# Patient Record
Sex: Female | Born: 2000 | Race: Black or African American | Hispanic: No | Marital: Single | Smoking: Never smoker
Health system: Southern US, Community
[De-identification: ages and names within clinical notes are randomized; demographics above are authoritative.]

## PROBLEM LIST (undated history)

## (undated) DIAGNOSIS — D649 Anemia, unspecified: Secondary | ICD-10-CM

## (undated) HISTORY — PX: NO PAST SURGERIES: SHX2092

## (undated) HISTORY — DX: Anemia, unspecified: D64.9

---
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
## 2021-03-02 ENCOUNTER — Ambulatory Visit: Payer: Self-pay | Admitting: Nurse Practitioner

## 2021-03-12 ENCOUNTER — Ambulatory Visit (INDEPENDENT_AMBULATORY_CARE_PROVIDER_SITE_OTHER): Payer: 59 | Admitting: Nurse Practitioner

## 2021-03-12 ENCOUNTER — Other Ambulatory Visit: Payer: Self-pay

## 2021-03-12 ENCOUNTER — Encounter: Payer: Self-pay | Admitting: Nurse Practitioner

## 2021-03-12 VITALS — BP 113/75 | HR 85 | Temp 98.8°F | Ht 65.5 in | Wt 96.0 lb

## 2021-03-12 DIAGNOSIS — Z0001 Encounter for general adult medical examination with abnormal findings: Secondary | ICD-10-CM

## 2021-03-12 DIAGNOSIS — R636 Underweight: Secondary | ICD-10-CM | POA: Diagnosis not present

## 2021-03-12 DIAGNOSIS — Z139 Encounter for screening, unspecified: Secondary | ICD-10-CM | POA: Diagnosis not present

## 2021-03-12 DIAGNOSIS — Z7689 Persons encountering health services in other specified circumstances: Secondary | ICD-10-CM

## 2021-03-12 NOTE — Assessment & Plan Note (Addendum)
Wt Readings from Last 3 Encounters:  03/12/21 96 lb (43.5 kg) (2 %, Z= -2.13)*  05/09/20 100 lb 8 oz (45.6 kg) (6 %, Z= -1.59)*  04/30/17 97 lb 9.6 oz (44.3 kg) (16 %, Z= -1.00)*   * Growth percentiles are based on CDC (Girls, 2-20 Years) data.   BMI Readings from Last 3 Encounters:  03/12/21 15.73 kg/m (<1 %, Z= -3.11)*  05/09/20 16.72 kg/m (1 %, Z= -2.17)*  04/30/17 16.24 kg/m (5 %, Z= -1.66)*   * Growth percentiles are based on CDC (Girls, 2-20 Years) data.   -she states that she eats a lot but is unable to gain weight -will check TSH -she is going to go to [place] and [place] to Consulting civil engineer

## 2021-03-12 NOTE — Progress Notes (Signed)
Established Patient Office Visit  Subjective:  Patient ID: Jennifer Frey, female    DOB: 2001-03-18  Age: 20 y.o. MRN: [id number]  CC:  Chief Complaint  Patient presents with   New Patient (Initial Visit)    Here to establish care. Needs CPE for school, leaving Friday.    HPI Jennifer Frey presents for new patient visit/physical exam. Transferring care from [facility] Last physical was 04/2020 Last labs was drawn in Nov 2021.  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   Father lives in [place].   Social Determinants of Health   Financial Resource Strain: Not on file  Food Insecurity: Not on file  Transportation Needs: Not on file  Physical Activity: Not on file  Stress: Not on file  Social Connections: Not on file  Intimate Partner Violence: Not on file    Outpatient Medications Prior to Visit  Medication Sig Dispense Refill   ALAYCEN 1/35 tablet Take 1 tablet by mouth daily.     tretinoin (RETIN-A) 0.05 % cream Apply 1 application topically at bedtime. (Patient not taking: Reported on 03/12/2021)     No facility-administered  medications prior to visit.    No Known Allergies  ROS Review of Systems  Constitutional: Negative.   HENT: Negative.    Eyes: Negative.   Respiratory: Negative.    Cardiovascular: Negative.   Gastrointestinal: Negative.   Endocrine: Negative.   Genitourinary: Negative.   Musculoskeletal: Negative.   Skin: Negative.   Allergic/Immunologic: Negative.   Neurological: Negative.   Hematological: Negative.   Psychiatric/Behavioral: Negative.       Objective:    Physical Exam Constitutional:      Appearance: Normal appearance.     Comments: underweight  HENT:     Head: Normocephalic and atraumatic.     Right Ear: Tympanic membrane, ear canal and external ear normal.     Left Ear: Tympanic membrane, ear canal and external ear normal.     Nose: Nose normal.     Mouth/Throat:     Mouth: Mucous membranes are moist.     Pharynx: Oropharynx is clear.  Eyes:     Extraocular Movements: Extraocular movements intact.     Conjunctiva/sclera: Conjunctivae normal.     Pupils: Pupils are equal, round, and reactive to light.  Cardiovascular:     Rate and Rhythm: Normal rate and regular rhythm.     Pulses: Normal pulses.  Heart sounds: Normal heart sounds.  Pulmonary:     Effort: Pulmonary effort is normal.     Breath sounds: Normal breath sounds.  Abdominal:     General: Abdomen is flat. Bowel sounds are normal.     Palpations: Abdomen is soft.  Musculoskeletal:        General: Normal range of motion.     Cervical back: Normal range of motion and neck supple.  Skin:    General: Skin is warm and dry.     Capillary Refill: Capillary refill takes less than 2 seconds.  Neurological:     General: No focal deficit present.     Mental Status: She is alert and oriented to person, place, and time.     Cranial Nerves: No cranial nerve deficit.     Sensory: No sensory deficit.     Motor: No weakness.     Coordination: Coordination normal.     Gait: Gait normal.  Psychiatric:         Mood and Affect: Mood normal.        Behavior: Behavior normal.        Thought Content: Thought content normal.        Judgment: Judgment normal.    BP 113/75 (BP Location: Left Arm, Patient Position: Sitting, Cuff Size: Normal)   Pulse 85   Temp 98.8 F (37.1 C) (Oral)   Ht 5' 5.5" (1.664 m)   Wt 96 lb (43.5 kg)   LMP 02/25/2021 (Approximate)   SpO2 98%   BMI 15.73 kg/m  Wt Readings from Last 3 Encounters:  03/12/21 96 lb (43.5 kg) (2 %, Z= -2.13)*  05/09/20 100 lb 8 oz (45.6 kg) (6 %, Z= -1.59)*  04/30/17 97 lb 9.6 oz (44.3 kg) (16 %, Z= -1.00)*   * Growth percentiles are based on CDC (Girls, 2-20 Years) data.     Health Maintenance Due  Topic Date Due   HIV Screening  Never done   Hepatitis C Screening  Never done   INFLUENZA VACCINE  02/19/2021    There are no preventive care reminders to display for this patient.  Lab Results  Component Value Date   TSH 0.94 05/22/2020   Lab Results  Component Value Date   WBC 5.7 05/22/2020   HGB 10.4 (L) 05/22/2020   HCT 33.6 (L) 05/22/2020   MCV 85.3 05/22/2020   PLT 268 05/22/2020   Lab Results  Component Value Date   NA 138 05/22/2020   K 4.1 05/22/2020   CO2 24 05/22/2020   GLUCOSE 94 05/22/2020   BUN 9 05/22/2020   CREATININE 0.51 05/22/2020   BILITOT 0.2 05/22/2020   AST 14 05/22/2020   ALT 9 05/22/2020   PROT 7.7 05/22/2020   CALCIUM 10.0 05/22/2020   Lab Results  Component Value Date   CHOL 169 05/22/2020   Lab Results  Component Value Date   HDL 84 05/22/2020   Lab Results  Component Value Date   LDLCALC 72 05/22/2020   Lab Results  Component Value Date   TRIG 46 05/22/2020   Lab Results  Component Value Date   CHOLHDL 2.0 05/22/2020   Lab Results  Component Value Date   HGBA1C 5.6 05/22/2020      Assessment & Plan:   Problem List Items Addressed This Visit       Other   Encounter to establish care - Primary    -records available in Epic      Relevant Orders  Hepatitis  C Antibody   HIV antibody (with reflex)   CBC with Differential/Platelet   CMP14+EGFR   Lipid Panel With LDL/HDL Ratio   TSH   Underweight    Wt Readings from Last 3 Encounters:  03/12/21 96 lb (43.5 kg) (2 %, Z= -2.13)*  05/09/20 100 lb 8 oz (45.6 kg) (6 %, Z= -1.59)*  04/30/17 97 lb 9.6 oz (44.3 kg) (16 %, Z= -1.00)*   * Growth percentiles are based on CDC (Girls, 2-20 Years) data.   BMI Readings from Last 3 Encounters:  03/12/21 15.73 kg/m (<1 %, Z= -3.11)*  05/09/20 16.72 kg/m (1 %, Z= -2.17)*  04/30/17 16.24 kg/m (5 %, Z= -1.66)*   * Growth percentiles are based on CDC (Girls, 2-20 Years) data.  -she states that she eats a lot but is unable to gain weight -will check TSH -she is going to go to [place] to Conservation officer, historic buildings for general adult medical examination with abnormal findings    -underweight; otherwise PE is unremarkable      Other Visit Diagnoses     Screening due       Relevant Orders   Hepatitis C Antibody   HIV antibody (with reflex)       No orders of the defined types were placed in this encounter.   Follow-up: Return in about 1 year (around 03/12/2022) for Physical Exam.    Noreene Larsson, NP

## 2021-03-12 NOTE — Assessment & Plan Note (Signed)
-  underweight; otherwise PE is unremarkable

## 2021-03-12 NOTE — Assessment & Plan Note (Signed)
-  records available in Epic 

## 2021-03-13 LAB — CMP14+EGFR
ALT: 10 IU/L (ref 0–32)
AST: 17 IU/L (ref 0–40)
Albumin/Globulin Ratio: 1.5 (ref 1.2–2.2)
Albumin: 4.4 g/dL (ref 3.9–5.0)
Alkaline Phosphatase: 66 IU/L (ref 42–106)
BUN/Creatinine Ratio: 22 (ref 9–23)
BUN: 14 mg/dL (ref 6–20)
Bilirubin Total: <0.2 mg/dL (ref 0.0–1.2)
CO2: 19 mmol/L — ABNORMAL LOW (ref 20–29)
Calcium: 9.8 mg/dL (ref 8.7–10.2)
Chloride: 105 mmol/L (ref 96–106)
Creatinine, Ser: 0.64 mg/dL (ref 0.57–1.00)
Globulin, Total: 3 g/dL (ref 1.5–4.5)
Glucose: 86 mg/dL (ref 65–99)
Potassium: 4.1 mmol/L (ref 3.5–5.2)
Sodium: 141 mmol/L (ref 134–144)
Total Protein: 7.4 g/dL (ref 6.0–8.5)
eGFR: 131 mL/min/{1.73_m2} (ref >59)

## 2021-03-13 LAB — CBC WITH DIFFERENTIAL/PLATELET
Basophils Absolute: 0 10*3/uL (ref 0.0–0.2)
Basos: 0 %
EOS (ABSOLUTE): 0.2 10*3/uL (ref 0.0–0.4)
Eos: 2 %
Hematocrit: 36.5 % (ref 34.0–46.6)
Hemoglobin: 11.8 g/dL (ref 11.1–15.9)
Immature Grans (Abs): 0 10*3/uL (ref 0.0–0.1)
Immature Granulocytes: 0 %
Lymphocytes Absolute: 2 10*3/uL (ref 0.7–3.1)
Lymphs: 28 %
MCH: 28.1 pg (ref 26.6–33.0)
MCHC: 32.3 g/dL (ref 31.5–35.7)
MCV: 87 fL (ref 79–97)
Monocytes Absolute: 0.4 10*3/uL (ref 0.1–0.9)
Monocytes: 6 %
Neutrophils Absolute: 4.5 10*3/uL (ref 1.4–7.0)
Neutrophils: 64 %
Platelets: 270 10*3/uL (ref 150–450)
RBC: 4.2 x10E6/uL (ref 3.77–5.28)
RDW: 15.5 % — ABNORMAL HIGH (ref 11.7–15.4)
WBC: 7.2 10*3/uL (ref 3.4–10.8)

## 2021-03-13 LAB — LIPID PANEL WITH LDL/HDL RATIO
Cholesterol, Total: 175 mg/dL — ABNORMAL HIGH (ref 100–169)
HDL: 68 mg/dL (ref >39)
LDL Chol Calc (NIH): 97 mg/dL (ref 0–109)
LDL/HDL Ratio: 1.4 ratio (ref 0.0–3.2)
Triglycerides: 48 mg/dL (ref 0–89)
VLDL Cholesterol Cal: 10 mg/dL (ref 5–40)

## 2021-03-13 LAB — HEPATITIS C ANTIBODY: Hep C Virus Ab: <0.1 s/co ratio (ref 0.0–0.9)

## 2021-03-13 LAB — TSH: TSH: 0.604 u[IU]/mL (ref 0.450–4.500)

## 2021-03-13 LAB — HIV ANTIBODY (ROUTINE TESTING W REFLEX): HIV Screen 4th Generation wRfx: NONREACTIVE

## 2021-03-13 NOTE — Progress Notes (Signed)
Labs look great.

## 2021-05-15 ENCOUNTER — Ambulatory Visit: Payer: 59 | Admitting: Pediatrics

## 2022-03-06 ENCOUNTER — Ambulatory Visit (INDEPENDENT_AMBULATORY_CARE_PROVIDER_SITE_OTHER): Payer: 59 | Admitting: Nurse Practitioner

## 2022-03-06 ENCOUNTER — Encounter: Payer: Self-pay | Admitting: Nurse Practitioner

## 2022-03-06 VITALS — BP 101/64 | HR 88 | Ht 65.0 in | Wt 96.0 lb

## 2022-03-06 DIAGNOSIS — Z139 Encounter for screening, unspecified: Secondary | ICD-10-CM | POA: Diagnosis not present

## 2022-03-06 DIAGNOSIS — Z789 Other specified health status: Secondary | ICD-10-CM

## 2022-03-06 DIAGNOSIS — R636 Underweight: Secondary | ICD-10-CM

## 2022-03-06 DIAGNOSIS — Z0000 Encounter for general adult medical examination without abnormal findings: Secondary | ICD-10-CM | POA: Insufficient documentation

## 2022-03-06 NOTE — Assessment & Plan Note (Signed)
followed by OGYN  taking birth control to help control her period, never been sexually active  ON Lo loestrin fe 1mg  -66mcg/10mcg tablet, daily

## 2022-03-06 NOTE — Assessment & Plan Note (Signed)
Annual exam as documented.  Counseling done include healthy lifestyle involving committing to 150 minutes of exercise per week, heart healthy diet,  The importance of adequate sleep also discussed.  Regular use of seat belt and home safety were also discussed . Up to date with immunization.

## 2022-03-06 NOTE — Assessment & Plan Note (Signed)
Wt Readings from Last 3 Encounters:  03/06/22 96 lb (43.5 kg) (1 %, Z= -2.19)*  03/12/21 96 lb (43.5 kg) (2 %, Z= -2.13)*  05/09/20 100 lb 8 oz (45.6 kg) (6 %, Z= -1.59)*   * Growth percentiles are based on CDC (Girls, 2-20 Years) data.  weight is stable, denies loss of appetite, fever, malaise, states that she has always had low weight, feels very well.  Check TSH

## 2022-03-06 NOTE — Progress Notes (Addendum)
Complete physical exam  Patient: Jennifer Frey   DOB: October 20, 2000   21 y.o. Female  MRN: [id number]  Subjective:    Chief Complaint  Patient presents with   Annual Exam    cpe    Jennifer Frey is a 21 y.o. female with PMH of acne, underweight who presents today for yearly labs, last annual physical was with the [facility] on 11/27/2021. She generally feels fairly well. She reports sleeping well. She has no  additional problems to discuss today.  Has had eye her exam done within the past one year , wear prescription glasses.   Taking birth control to help control her period, never been sexually active   Most recent fall risk assessment:    03/06/2022    2:51 PM  [place] in the past year? 0  Number falls in past yr: 0  Injury with Fall? 0  Risk for fall due to : No Fall Risks  Follow up Falls evaluation completed     Most recent depression screenings:    03/06/2022    2:51 PM 03/12/2021    9:29 AM  PHQ 2/9 Scores  PHQ - 2 Score 0 0        Patient Care Team: Renee Rival, FNP as PCP - General (Nurse Practitioner)   Outpatient Medications Prior to Visit  Medication Sig   LO LOESTRIN FE 1 MG-10 MCG / 10 MCG tablet Take 1 tablet by mouth daily.   [DISCONTINUED] ALAYCEN 1/35 tablet Take 1 tablet by mouth daily.   No facility-administered medications prior to visit.    Review of Systems  Constitutional: Negative.   HENT: Negative.    Eyes: Negative.   Respiratory: Negative.    Gastrointestinal: Negative.   Genitourinary: Negative.   Musculoskeletal: Negative.   Skin: Negative.   Endo/Heme/Allergies: Negative.   Psychiatric/Behavioral: Negative.            Objective:     BP 101/64 (BP Location: Left Arm, Patient Position: Sitting, Cuff Size: Normal)   Pulse 88   Ht 5' 5"  (1.651 m)   Wt 96 lb (43.5 kg)   LMP 11/19/2021 (Approximate)   SpO2 94%   BMI 15.98 kg/m    Physical Exam Constitutional:      General: She is not in acute  distress.    Appearance: Normal appearance. She is not ill-appearing, toxic-appearing or diaphoretic.  HENT:     Head: Normocephalic.     Right Ear: Tympanic membrane, ear canal and external ear normal. There is no impacted cerumen.     Left Ear: Tympanic membrane, ear canal and external ear normal. There is no impacted cerumen.     Nose: No congestion or rhinorrhea.     Mouth/Throat:     Mouth: Mucous membranes are moist.     Pharynx: Oropharynx is clear. No oropharyngeal exudate or posterior oropharyngeal erythema.  Eyes:     General: No scleral icterus.       Right eye: No discharge.        Left eye: No discharge.     Extraocular Movements: Extraocular movements intact.     Conjunctiva/sclera: Conjunctivae normal.     Pupils: Pupils are equal, round, and reactive to light.  Neck:     Vascular: No carotid bruit.  Cardiovascular:     Rate and Rhythm: Normal rate and regular rhythm.     Pulses: Normal pulses.     Heart sounds: Normal heart sounds. No  murmur heard.    No friction rub. No gallop.  Pulmonary:     Effort: Pulmonary effort is normal. No respiratory distress.     Breath sounds: Normal breath sounds. No stridor. No wheezing, rhonchi or rales.  Chest:     Chest wall: No tenderness.  Abdominal:     General: Abdomen is flat. Bowel sounds are normal. There is no distension.     Palpations: There is no mass.     Tenderness: There is no abdominal tenderness. There is no right CVA tenderness, left CVA tenderness, guarding or rebound.     Hernia: No hernia is present.  Musculoskeletal:        General: No swelling, tenderness, deformity or signs of injury. Normal range of motion.     Cervical back: Normal range of motion and neck supple. No rigidity or tenderness.     Right lower leg: No edema.     Left lower leg: No edema.  Lymphadenopathy:     Cervical: No cervical adenopathy.  Skin:    General: Skin is warm and dry.     Capillary Refill: Capillary refill takes less than  2 seconds.     Coloration: Skin is not jaundiced or pale.     Findings: No bruising, erythema, lesion or rash.  Neurological:     Mental Status: She is alert and oriented to person, place, and time.     Cranial Nerves: No cranial nerve deficit.     Sensory: No sensory deficit.     Motor: No weakness.     Coordination: Coordination normal.     Gait: Gait normal.     Deep Tendon Reflexes: Reflexes normal.  Psychiatric:        Mood and Affect: Mood normal.        Behavior: Behavior normal.        Thought Content: Thought content normal.        Judgment: Judgment normal.      No results found for any visits on 03/06/22.     Assessment & Plan:    Routine Health Maintenance and Physical Exam  Immunization History  Administered Date(s) Administered   DTaP 06/29/2002, 09/08/2002, 04/05/2003, 10/17/2005, 03/31/2007   HPV 9-valent 03/29/2015, 08/04/2015   HPV Quadrivalent 01/25/2015   Hepatitis A 10/17/2005, 04/21/2006   Hepatitis B 15-Mar-2002, 06/15/2002, 04/05/2003   HiB (PRP-OMP) 07/09/2002, 10/17/2005, 10/17/2005   IPV 07/09/2002, 09/08/2002, 05/31/2004, 03/31/2007   Influenza,inj,Quad PF,6+ Mos 08/09/2015   MMR 07/05/2004, 03/31/2007   Meningococcal B, OMV 05/09/2020   Meningococcal Conjugate 01/25/2015, 05/09/2020   Pneumococcal Conjugate-13 07/09/2002, 04/05/2003, 10/17/2005, 10/17/2005   Tdap 01/25/2015   Varicella 07/05/2004, 03/31/2007    Health Maintenance  Topic Date Due   INFLUENZA VACCINE  02/19/2022   TETANUS/TDAP  01/24/2025   HPV VACCINES  Completed   Hepatitis C Screening  Completed   HIV Screening  Completed    Discussed health benefits of physical activity, and encouraged her to engage in regular exercise appropriate for her age and condition.  Problem List Items Addressed This Visit       Other   Underweight - Primary    Wt Readings from Last 3 Encounters:  03/06/22 96 lb (43.5 kg) (1 %, Z= -2.19)*  03/12/21 96 lb (43.5 kg) (2 %, Z= -2.13)*   05/09/20 100 lb 8 oz (45.6 kg) (6 %, Z= -1.59)*   * Growth percentiles are based on CDC (Girls, 2-20 Years) data.  weight is stable, denies loss of appetite,  fever, malaise, states that she has always had low weight, feels very well.  Check TSH       Relevant Orders   Vitamin D (25 hydroxy)   Uses contraception    followed by OGYN  taking birth control to help control her period, never been sexually active  ON Lo loestrin fe 32m -196m/10mcg tablet, daily       Relevant Medications   LO LOESTRIN FE 1 MG-10 MCG / 10 MCG tablet   Screening due   Relevant Orders   CBC with Differential   CMP14+EGFR   TSH   Vitamin D (25 hydroxy)   Lipid Profile   Return in about 1 year (around 03/07/2023) for for labs , gets CPE with OBGYN.     FoRenee RivalFNP

## 2022-03-06 NOTE — Patient Instructions (Signed)

## 2022-03-12 ENCOUNTER — Encounter: Payer: 59 | Admitting: Nurse Practitioner

## 2022-03-27 ENCOUNTER — Encounter: Payer: 59 | Admitting: Nurse Practitioner

## 2022-07-11 ENCOUNTER — Ambulatory Visit
Admission: EM | Admit: 2022-07-11 | Discharge: 2022-07-11 | Disposition: A | Discharge status: Home / Self Care | Payer: 59 | Attending: Internal Medicine | Admitting: Internal Medicine

## 2022-07-11 DIAGNOSIS — J069 Acute upper respiratory infection, unspecified: Secondary | ICD-10-CM | POA: Diagnosis not present

## 2022-07-11 DIAGNOSIS — J029 Acute pharyngitis, unspecified: Secondary | ICD-10-CM | POA: Diagnosis not present

## 2022-07-11 LAB — POCT RAPID STREP A (OFFICE): Rapid Strep A Screen: NEGATIVE

## 2022-07-11 MED ORDER — BENZONATATE 100 MG PO CAPS: 100.0000 mg | ORAL_CAPSULE | Freq: Three times a day (TID) | ORAL | 0 refills | Status: AC | PRN

## 2022-07-11 NOTE — ED Triage Notes (Signed)
Pt reports a sore throat since Friday. On Monday and Tuesday it  got worst, major headache with right ear pain. Took nasal congestion meds, cold and cough meds, and nasal spray which gave some relief.

## 2022-07-11 NOTE — ED Provider Notes (Signed)
[facility]    CSN: [id number] Arrival date & time: 07/11/22  1415      History   Chief Complaint Chief Complaint  Patient presents with   Cough   Otalgia    HPI Jennifer Frey is a 21 y.o. female.   Patient presents for 6 days of congested cough, postnasal drainage and sneezing, sore throat, headache, hoarseness, right ear pain without drainage.  Reports symptoms have slowly improved, however sore throat remains and cough is new.  No fever, bikes or chills.  No shortness of breath or chest pain, chest or nasal congestion, runny nose, abdominal pain, nausea or vomiting, diarrhea, loss of taste or smell, or fatigue.  Reports she is on oral contraceptives, has not missed any pills, is confident she is not pregnant.  She does not have menses with oral contraceptives.  Has taken over-the-counter medications including Tylenol, cough medication, nasal spray, and nasal congestion pills which helped temporarily.  Reports her brother was sick 1 week before she came back from college, he has not diagnosed with anything that she knows of.    Past Medical History:  Diagnosis Date   Anemia     Patient Active Problem List   Diagnosis Date Noted   Uses contraception 03/06/2022   Screening due 03/06/2022   Encounter to establish care 03/12/2021   Underweight 03/12/2021   Encounter for general adult medical examination with abnormal findings 03/12/2021   Failed vision screen 03/12/2016    Past Surgical History:  Procedure Laterality Date   NO PAST SURGERIES      OB History   No obstetric history on file.      Home Medications    Prior to Admission medications   Medication Sig Start Date End Date Taking? Authorizing Provider  benzonatate (TESSALON) 100 MG capsule Take 1 capsule (100 mg total) by mouth 3 (three) times daily as needed for cough. Do not take with alcohol or while driving or operating heavy machinery.  May cause drowsiness. 07/11/22  Yes Cathlean Marseilles A, NP  LO LOESTRIN FE 1 MG-10 MCG / 10 MCG tablet Take 1 tablet by mouth daily. 02/19/22   [provider]    Family History Family History  Problem Relation Age of Onset   Healthy Mother    Diabetes Father    Healthy Brother    Sickle cell trait Brother    Cancer Maternal Grandfather        prostate    Social History Social History   Tobacco Use   Smoking status: Never   Smokeless tobacco: Never  Vaping Use   Vaping Use: Never used  Substance Use Topics   Alcohol use: Never   Drug use: Never     Allergies   Patient has no known allergies.   Review of Systems Review of Systems Per HPI  Physical Exam Triage Vital Signs ED Triage Vitals  Enc Vitals Group     BP 07/11/22 1552 118/78     Pulse Rate 07/11/22 1552 85     Resp 07/11/22 1552 20     Temp 07/11/22 1552 98.7 F (37.1 C)     Temp Source 07/11/22 1552 Oral     SpO2 07/11/22 1552 100 %     Weight --      Height --      Head Circumference --      Peak Flow --      Pain Score 07/11/22 1555 2     Pain  Loc --      Pain Edu? --      Excl. in GC? --    No data found.  Updated Vital Signs BP 118/78 (BP Location: Right Arm)   Pulse 85   Temp 98.7 F (37.1 C) (Oral)   Resp 20   SpO2 100%   Visual Acuity Right Eye Distance:   Left Eye Distance:   Bilateral Distance:    Right Eye Near:   Left Eye Near:    Bilateral Near:     Physical Exam Vitals and nursing note reviewed.  Constitutional:      General: She is not in acute distress.    Appearance: Normal appearance. She is not ill-appearing or toxic-appearing.  HENT:     Head: Normocephalic and atraumatic.     Right Ear: Tympanic membrane, ear canal and external ear normal.     Left Ear: Tympanic membrane, ear canal and external ear normal.     Nose: Congestion and rhinorrhea present.     Mouth/Throat:     Mouth: Mucous membranes are moist.     Pharynx: Oropharynx is clear. Posterior oropharyngeal erythema present. No  oropharyngeal exudate.  Eyes:     General: No scleral icterus.    Extraocular Movements: Extraocular movements intact.  Cardiovascular:     Rate and Rhythm: Normal rate and regular rhythm.     Heart sounds: Normal heart sounds.  Pulmonary:     Effort: Pulmonary effort is normal. No respiratory distress.     Breath sounds: Normal breath sounds. No wheezing, rhonchi or rales.  Abdominal:     General: Abdomen is flat. Bowel sounds are normal. There is no distension.     Palpations: Abdomen is soft.     Tenderness: There is no abdominal tenderness.  Musculoskeletal:     Cervical back: Normal range of motion and neck supple.  Lymphadenopathy:     Cervical: No cervical adenopathy.  Skin:    General: Skin is warm and dry.     Coloration: Skin is not jaundiced or pale.     Findings: No erythema or rash.  Neurological:     Mental Status: She is alert and oriented to person, place, and time.  Psychiatric:        Behavior: Behavior is cooperative.      UC Treatments / Results  Labs (all labs ordered are listed, but only abnormal results are displayed) Labs Reviewed  POCT RAPID STREP A (OFFICE)    EKG   Radiology No results found.  Procedures Procedures (including critical care time)  Medications Ordered in UC Medications - No data to display  Initial Impression / Assessment and Plan / UC Course  I have reviewed the triage vital signs and the nursing notes.  Pertinent labs & imaging results that were available during my care of the patient were reviewed by me and considered in my medical decision making (see chart for details).   Patient is well-appearing, normotensive, afebrile, not tachycardic, not tachypneic, oxygenating well on room air.    Viral URI with cough Suspect viral etiology Viral testing deferred given length of symptoms Supportive care discussed Start cough suppressant medication ER and return precautions discussed  The patient was given the  opportunity to ask questions.  All questions answered to their satisfaction.  The patient is in agreement to this plan.    Final Clinical Impressions(s) / UC Diagnoses   Final diagnoses:  Viral URI with cough     Discharge Instructions  You have a viral upper respiratory infection.  Symptoms should improve over the next week to 10 days.  If you develop chest pain or shortness of breath, go to the emergency room.  Some things that can make you feel better are: - Increased rest - Increasing fluid with water/sugar free electrolytes - Acetaminophen and ibuprofen as needed for fever/pain - Salt water gargling, chloraseptic spray and throat lozenges for sore throat - OTC guaifenesin (Mucinex) 600 mg twice daily for nasal congestion - Saline sinus flushes or a neti pot for nasal congestion - Humidifying the air -Tessalon Perles as needed for dry cough     ED Prescriptions     Medication Sig Dispense Auth. Provider   benzonatate (TESSALON) 100 MG capsule Take 1 capsule (100 mg total) by mouth 3 (three) times daily as needed for cough. Do not take with alcohol or while driving or operating heavy machinery.  May cause drowsiness. 21 capsule Valentino Nose, NP      PDMP not reviewed this encounter.   Valentino Nose, NP 07/11/22 [phone]

## 2022-07-11 NOTE — Discharge Instructions (Addendum)
You have a viral upper respiratory infection.  Symptoms should improve over the next week to 10 days.  If you develop chest pain or shortness of breath, go to the emergency room.  Some things that can make you feel better are: - Increased rest - Increasing fluid with water/sugar free electrolytes - Acetaminophen and ibuprofen as needed for fever/pain - Salt water gargling, chloraseptic spray and throat lozenges for sore throat - OTC guaifenesin (Mucinex) 600 mg twice daily for nasal congestion - Saline sinus flushes or a neti pot for nasal congestion - Humidifying the air -Tessalon Perles as needed for dry cough

## 2023-03-12 ENCOUNTER — Encounter: Payer: 59 | Admitting: Nurse Practitioner

## 2023-04-03 ENCOUNTER — Encounter: Payer: Self-pay | Admitting: *Deleted

## 2024-04-09 ENCOUNTER — Encounter: Payer: Self-pay | Admitting: *Deleted
